# Patient Record
Sex: Male | Born: 2000
Health system: Southern US, Community
[De-identification: ages and names within clinical notes are randomized; demographics above are authoritative.]

## PROBLEM LIST (undated history)

## (undated) DIAGNOSIS — F909 Attention-deficit hyperactivity disorder, unspecified type: Secondary | ICD-10-CM

## (undated) DIAGNOSIS — R1115 Cyclical vomiting syndrome unrelated to migraine: Secondary | ICD-10-CM

## (undated) HISTORY — DX: Attention-deficit hyperactivity disorder, unspecified type: F90.9

## (undated) HISTORY — DX: Cyclical vomiting syndrome unrelated to migraine: R11.15

---
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
## 2018-03-04 ENCOUNTER — Telehealth: Payer: Self-pay | Admitting: Internal Medicine

## 2018-03-04 NOTE — Telephone Encounter (Signed)
Copied from CRM [id number]. Topic: General - Other >> Mar 04, 2018  8:05 AM Gean BirchwoodWilliams-Neal, Sade R wrote: Pts mom is calling in stating that Dr.John stated he would take pt on as a new pt due to family being pts currently. Pt needs a new pt appt no verification stated he was accepted. Please call mom with information.   Callback # [phone]  Mother is Meryl DareChris Xu, Dr. Jonny RuizJohn are you aware of this & it is okay!

## 2018-03-04 NOTE — Telephone Encounter (Signed)
Ok with me 

## 2018-03-04 NOTE — Telephone Encounter (Signed)
Patient is scheduled for 04/03/18.

## 2018-03-04 NOTE — Telephone Encounter (Signed)
Can you please change this visit to NEW PATIENT?

## 2018-03-04 NOTE — Telephone Encounter (Signed)
LVM to inform patient we could set up a new patient appointment.   Please set up a new patient appointment - Type of visit as OV & in the notes, NEW PATIENT/Insurance info  Send us a message in this TE to inform us & we will have the manger override the OV to change it to NP.

## 2018-04-03 ENCOUNTER — Ambulatory Visit: Payer: 59 | Admitting: Internal Medicine

## 2018-04-03 ENCOUNTER — Encounter: Payer: Self-pay | Admitting: Internal Medicine

## 2018-04-03 VITALS — BP 114/76 | HR 67 | Temp 97.8°F | Ht 75.23 in | Wt 165.0 lb

## 2018-04-03 DIAGNOSIS — Z0000 Encounter for general adult medical examination without abnormal findings: Secondary | ICD-10-CM | POA: Diagnosis not present

## 2018-04-03 DIAGNOSIS — Z114 Encounter for screening for human immunodeficiency virus [HIV]: Secondary | ICD-10-CM

## 2018-04-03 DIAGNOSIS — F909 Attention-deficit hyperactivity disorder, unspecified type: Secondary | ICD-10-CM

## 2018-04-03 NOTE — Assessment & Plan Note (Signed)
Stable, declines need for tx

## 2018-04-03 NOTE — Assessment & Plan Note (Signed)

## 2018-04-03 NOTE — Patient Instructions (Signed)

## 2018-04-03 NOTE — Progress Notes (Signed)
Subjective:    Patient ID: Walter Wallace, male    DOB: 2000/12/28, 17 y.o.   MRN: [id number]  HPI  Here as new pt to establish; Here for wellness;  Overall doing ok;  Pt denies Chest pain, worsening SOB, DOE, wheezing, orthopnea, PND, worsening LE edema, palpitations, dizziness or syncope.  Pt denies neurological change such as new headache, facial or extremity weakness.  Pt denies polydipsia, polyuria, or low sugar symptoms. Pt states overall good compliance with treatment and medications, good tolerability, and has been trying to follow appropriate diet.  Pt denies worsening depressive symptoms, suicidal ideation or panic. No fever, night sweats, wt loss, loss of appetite, or other constitutional symptoms.  Pt states good ability with ADL's, has low fall risk, home safety reviewed and adequate, no other significant changes in hearing or vision, and occasionally active with exercise. No tobacco for 3 mo.   No new complaints.  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 Does not feel he needs med for ADD Past Medical History:  Diagnosis Date  . ADHD (attention deficit hyperactivity disorder)   . Cyclic vomiting syndrome    History reviewed. No pertinent surgical history.  reports that he quit smoking about 1 years ago. His smoking use included cigarettes. He started smoking about 4 years ago. He smoked 0.00 packs per day. He has never used smokeless tobacco. He reports that he has current or past drug history. Drug: Marijuana. Frequency: 4.00 times per week. He reports that he does not drink alcohol. family history includes ADD / ADHD in his brother; Cholelithiasis in his maternal grandmother; GER disease in his maternal grandmother; Irritable bowel syndrome in his maternal grandmother; Migraines in his maternal grandmother; Pancreatitis in his paternal grandmother. No Known Allergies No current outpatient medications on file prior to visit.    No current facility-administered medications on file prior to visit.    Review of Systems Constitutional: Negative for other unusual diaphoresis, sweats, appetite or weight changes HENT: Negative for other worsening hearing loss, ear pain, facial swelling, mouth sores or neck stiffness.   Eyes: Negative for other worsening pain, redness or other visual disturbance.  Respiratory: Negative for other stridor or swelling Cardiovascular: Negative for other palpitations or other chest pain  Gastrointestinal: Negative for worsening diarrhea or loose stools, blood in stool, distention or other pain Genitourinary: Negative for hematuria, flank pain or other change in urine volume.  Musculoskeletal: Negative for myalgias or other joint swelling.  Skin: Negative for other color change, or other wound or worsening drainage.  Neurological: Negative for other syncope or numbness. Hematological: Negative for other adenopathy or swelling Psychiatric/Behavioral: Negative for hallucinations, other worsening agitation, SI, self-injury, or new decreased concentration All other system neg per pt    Objective:   Physical Exam BP 114/76   Pulse 67   Temp 97.8 F (36.6 C) (Oral)   Ht 6' 3.23" (1.911 m)   Wt 165 lb (74.8 kg)   SpO2 98%   BMI 20.50 kg/m  VS noted,  Constitutional: Pt is oriented to person, place, and time. Appears well-developed and well-nourished, in no significant distress and comfortable Head: Normocephalic and atraumatic  Eyes: Conjunctivae and EOM are normal. Pupils are equal, round, and reactive to light Right Ear: External ear normal without discharge Left Ear: External ear normal without discharge Nose: Nose without discharge or deformity Mouth/Throat: Oropharynx is without other ulcerations and moist  Neck: Normal range of  motion. Neck supple. No JVD present. No tracheal deviation present or significant neck LA or mass Cardiovascular: Normal rate, regular rhythm, normal heart  sounds and intact distal pulses.   Pulmonary/Chest: WOB normal and breath sounds without rales or wheezing  Abdominal: Soft. Bowel sounds are normal. NT. No HSM  Musculoskeletal: Normal range of motion. Exhibits no edema Lymphadenopathy: Has no other cervical adenopathy.  Neurological: Pt is alert and oriented to person, place, and time. Pt has normal reflexes. No cranial nerve deficit. Motor grossly intact, Gait intact Skin: Skin is warm and dry. No rash noted or new ulcerations Psychiatric:  Has normal mood and affect. Behavior is normal without agitation No other exam findings     Assessment & Plan:

## 2019-07-02 ENCOUNTER — Other Ambulatory Visit: Payer: Self-pay

## 2019-07-02 DIAGNOSIS — Z20822 Contact with and (suspected) exposure to covid-19: Secondary | ICD-10-CM

## 2019-07-03 LAB — NOVEL CORONAVIRUS, NAA: SARS-CoV-2, NAA: NOT DETECTED

## 2019-09-11 ENCOUNTER — Ambulatory Visit: Payer: 59 | Attending: Internal Medicine

## 2019-09-11 DIAGNOSIS — Z20822 Contact with and (suspected) exposure to covid-19: Secondary | ICD-10-CM

## 2019-09-12 LAB — NOVEL CORONAVIRUS, NAA: SARS-CoV-2, NAA: NOT DETECTED

## 2019-09-14 ENCOUNTER — Encounter: Payer: Self-pay | Admitting: Internal Medicine

## 2019-09-14 ENCOUNTER — Ambulatory Visit (INDEPENDENT_AMBULATORY_CARE_PROVIDER_SITE_OTHER): Payer: 59 | Admitting: Internal Medicine

## 2019-09-14 ENCOUNTER — Other Ambulatory Visit: Payer: Self-pay

## 2019-09-14 VITALS — BP 128/84 | HR 94 | Temp 97.8°F | Ht 75.0 in | Wt 165.0 lb

## 2019-09-14 DIAGNOSIS — Z0000 Encounter for general adult medical examination without abnormal findings: Secondary | ICD-10-CM | POA: Diagnosis not present

## 2019-09-14 DIAGNOSIS — Z114 Encounter for screening for human immunodeficiency virus [HIV]: Secondary | ICD-10-CM | POA: Diagnosis not present

## 2019-09-14 LAB — URINALYSIS, ROUTINE W REFLEX MICROSCOPIC
Bilirubin Urine: NEGATIVE
Hgb urine dipstick: NEGATIVE
Ketones, ur: NEGATIVE
Leukocytes,Ua: NEGATIVE
Nitrite: NEGATIVE
RBC / HPF: NONE SEEN (ref 0–?)
Specific Gravity, Urine: >=1.03 — AB (ref 1.000–1.030)
Urine Glucose: NEGATIVE
Urobilinogen, UA: 0.2 (ref 0.0–1.0)
pH: 6 (ref 5.0–8.0)

## 2019-09-14 LAB — CBC WITH DIFFERENTIAL/PLATELET
Basophils Absolute: 0 10*3/uL (ref 0.0–0.1)
Basophils Relative: 0.3 % (ref 0.0–3.0)
Eosinophils Absolute: 0 10*3/uL (ref 0.0–0.7)
Eosinophils Relative: 0.5 % (ref 0.0–5.0)
HCT: 46.4 % (ref 39.0–52.0)
Hemoglobin: 15.9 g/dL (ref 13.0–17.0)
Lymphocytes Relative: 25.9 % (ref 12.0–46.0)
Lymphs Abs: 2.3 10*3/uL (ref 0.7–4.0)
MCHC: 34.3 g/dL (ref 30.0–36.0)
MCV: 86.9 fl (ref 78.0–100.0)
Monocytes Absolute: 0.5 10*3/uL (ref 0.1–1.0)
Monocytes Relative: 5.5 % (ref 3.0–12.0)
Neutro Abs: 6 10*3/uL (ref 1.4–7.7)
Neutrophils Relative %: 67.8 % (ref 43.0–77.0)
Platelets: 249 10*3/uL (ref 150.0–400.0)
RBC: 5.34 Mil/uL (ref 4.22–5.81)
RDW: 13.4 % (ref 11.5–14.6)
WBC: 8.9 10*3/uL (ref 4.5–10.5)

## 2019-09-14 LAB — TSH: TSH: 1.74 u[IU]/mL (ref 0.35–5.50)

## 2019-09-14 LAB — HEPATIC FUNCTION PANEL
ALT: 13 U/L (ref 0–53)
AST: 17 U/L (ref 0–37)
Albumin: 5.1 g/dL (ref 3.5–5.2)
Alkaline Phosphatase: 70 U/L (ref 39–117)
Bilirubin, Direct: 0.1 mg/dL (ref 0.0–0.3)
Total Bilirubin: 0.5 mg/dL (ref 0.2–1.2)
Total Protein: 8.3 g/dL (ref 6.0–8.3)

## 2019-09-14 LAB — LIPID PANEL
Cholesterol: 148 mg/dL (ref 0–200)
HDL: 48.8 mg/dL (ref >39.00)
LDL Cholesterol: 83 mg/dL (ref 0–99)
NonHDL: 98.82
Total CHOL/HDL Ratio: 3
Triglycerides: 79 mg/dL (ref 0.0–149.0)
VLDL: 15.8 mg/dL (ref 0.0–40.0)

## 2019-09-14 LAB — BASIC METABOLIC PANEL
BUN: 11 mg/dL (ref 6–23)
CO2: 29 mEq/L (ref 19–32)
Calcium: 10.4 mg/dL (ref 8.4–10.5)
Chloride: 102 mEq/L (ref 96–112)
Creatinine, Ser: 0.97 mg/dL (ref 0.40–1.50)
GFR: 97.93 mL/min (ref >60.00)
Glucose, Bld: 83 mg/dL (ref 70–99)
Potassium: 3.6 mEq/L (ref 3.5–5.1)
Sodium: 140 mEq/L (ref 135–145)

## 2019-09-14 NOTE — Assessment & Plan Note (Signed)

## 2019-09-14 NOTE — Progress Notes (Signed)
Subjective:    Patient ID: Walter Wallace, male    DOB: 2001-07-21, 19 y.o.   MRN: [id number]  HPI  Here for wellness and f/u;  Overall doing ok;  Pt denies Chest pain, worsening SOB, DOE, wheezing, orthopnea, PND, worsening LE edema, palpitations, dizziness or syncope.  Pt denies neurological change such as new headache, facial or extremity weakness.  Pt denies polydipsia, polyuria, or low sugar symptoms. Pt states overall good compliance with treatment and medications, good tolerability, and has been trying to follow appropriate diet.  Pt denies worsening depressive symptoms, suicidal ideation or panic. No fever, night sweats, wt loss, loss of appetite, or other constitutional symptoms.  Pt states good ability with ADL's, has low fall risk, home safety reviewed and adequate, no other significant changes in hearing or vision, and active with exercise.  Doing home renovations now.   Concerned he is not gaining wt. At one point was 185 then had lost up to 20 lb despite eating up to 4000 cal per day. Wt Readings from Last 3 Encounters:  09/14/19 165 lb (74.8 kg)  04/03/18 165 lb (74.8 kg) (67 %, Z= 0.44)*  09/21/15 161 lb (73 kg) (78 %, Z= 0.76)*   * Growth percentiles are based on CDC (Boys, 2-20 Years) data.   Past Medical History:  Diagnosis Date  . ADHD (attention deficit hyperactivity disorder)   . Cyclic vomiting syndrome    No past surgical history on file.  reports that he quit smoking about 3 years ago. His smoking use included cigarettes. He started smoking about 6 years ago. He smoked 0.00 packs per day. He has never used smokeless tobacco. He reports current drug use. Frequency: 4.00 times per week. Drug: Marijuana. He reports that he does not drink alcohol. family history includes ADD / ADHD in his brother; Cholelithiasis in his maternal grandmother; GER disease in his maternal grandmother; Irritable bowel syndrome in his maternal grandmother; Migraines in his maternal grandmother;  Pancreatitis in his paternal grandmother. No Known Allergies No current outpatient medications on file prior to visit.   No current facility-administered medications on file prior to visit.   Review of Systems  Constitutional: Negative for other unusual diaphoresis or sweats HENT: Negative for ear discharge or swelling Eyes: Negative for other worsening visual disturbances Respiratory: Negative for stridor or other swelling  Gastrointestinal: Negative for worsening distension or other blood Genitourinary: Negative for retention or other urinary change Musculoskeletal: Negative for other MSK pain or swelling Skin: Negative for color change or other new lesions Neurological: Negative for worsening tremors and other numbness  Psychiatric/Behavioral: Negative for worsening agitation or other fatigue All otherwise neg per pt     Objective:   Physical Exam BP 128/84   Pulse 94   Temp 97.8 F (36.6 C) (Oral)   Ht 6\' 3"  (1.905 m)   Wt 165 lb (74.8 kg)   SpO2 97%   BMI 20.62 kg/m  VS noted,  Constitutional: Pt appears in NAD HENT: Head: NCAT.  Right Ear: External ear normal.  Left Ear: External ear normal.  Eyes: . Pupils are equal, round, and reactive to light. Conjunctivae and EOM are normal Nose: without d/c or deformity Neck: Neck supple. Gross normal ROM Cardiovascular: Normal rate and regular rhythm.   Pulmonary/Chest: Effort normal and breath sounds without rales or wheezing.  Abd:  Soft, NT, ND, + BS, no organomegaly Neurological: Pt is alert. At baseline orientation, motor grossly intact Skin: Skin is warm. No rashes, other new  lesions, no LE edema Psychiatric: Pt behavior is normal without agitation  All otherwise neg per pt Lab Results  Component Value Date   WBC 8.9 09/14/2019   HGB 15.9 09/14/2019   HCT 46.4 09/14/2019   PLT 249.0 09/14/2019   GLUCOSE 83 09/14/2019   CHOL 148 09/14/2019   TRIG 79.0 09/14/2019   HDL 48.80 09/14/2019   LDLCALC 83 09/14/2019     ALT 13 09/14/2019   AST 17 09/14/2019   NA 140 09/14/2019   K 3.6 09/14/2019   CL 102 09/14/2019   CREATININE 0.97 09/14/2019   BUN 11 09/14/2019   CO2 29 09/14/2019   TSH 1.74 09/14/2019        Assessment & Plan:

## 2019-09-14 NOTE — Patient Instructions (Signed)
Please continue all other medications as before, and refills have been done if requested.  Please have the pharmacy call with any other refills you may need.  Please continue your efforts at being more active, low cholesterol diet, and weight control.  You are otherwise up to date with prevention measures today.  Please keep your appointments with your specialists as you may have planned  Please go to the LAB at the blood drawing area for the tests to be done  You will be contacted by phone if any changes need to be made immediately.  Otherwise, you will receive a letter about your results with an explanation, but please check with MyChart first.  Please remember to sign up for MyChart if you have not done so, as this will be important to you in the future with finding out test results, communicating by private email, and scheduling acute appointments online when needed.  Please make an Appointment to return for your 2 year visit, or sooner if needed

## 2019-09-15 LAB — HIV ANTIBODY (ROUTINE TESTING W REFLEX): HIV 1&2 Ab, 4th Generation: NONREACTIVE

## 2020-11-30 ENCOUNTER — Telehealth (INDEPENDENT_AMBULATORY_CARE_PROVIDER_SITE_OTHER): Payer: 59 | Admitting: Internal Medicine

## 2020-11-30 DIAGNOSIS — J029 Acute pharyngitis, unspecified: Secondary | ICD-10-CM

## 2020-11-30 MED ORDER — AZITHROMYCIN 250 MG PO TABS: ORAL_TABLET | ORAL | 0 refills | Status: DC

## 2020-11-30 NOTE — Patient Instructions (Signed)
Please take all new medication as prescribed - the antibiotic  Please also check the COVID test you have with you and let us know results if positive  Please continue all other medications as before, and refills have been done if requested.  Please have the pharmacy call with any other refills you may need.  Please keep your appointments with your specialists as you may have planned

## 2020-11-30 NOTE — Progress Notes (Signed)
Patient ID: Walter Wallace, male   DOB: 05-23-2001, 20 y.o.   MRN: [id number]  Virtual Visit via Video Note  I connected with Walter Wallace on 11/30/20 at 11:20 AM EDT by a video enabled telemedicine application and verified that I am speaking with the correct person using two identifiers.  Location of all participants today Patient: at home Provider: at office   I discussed the limitations of evaluation and management by telemedicine and the availability of in person appointments. The patient expressed understanding and agreed to proceed.  History of Present Illness:  Here with 6 days acute onset fever, ST pain, pressure, headache, general weakness and malaise, but pt denies chest pain, wheezing, increased sob or doe, orthopnea, PND, increased LE swelling, palpitations, dizziness or syncope. Denies new focal neuro s/s,  Pt denies polydipsia, polyuria, No other new complaints Past Medical History:  Diagnosis Date  . ADHD (attention deficit hyperactivity disorder)   . Cyclic vomiting syndrome    No past surgical history on file.  reports that he quit smoking about 4 years ago. His smoking use included cigarettes. He started smoking about 7 years ago. He smoked 0.00 packs per day. He has never used smokeless tobacco. He reports current drug use. Frequency: 4.00 times per week. Drug: Marijuana. He reports that he does not drink alcohol. family history includes ADD / ADHD in his brother; Cholelithiasis in his maternal grandmother; GER disease in his maternal grandmother; Irritable bowel syndrome in his maternal grandmother; Migraines in his maternal grandmother; Pancreatitis in his paternal grandmother. No Known Allergies No current outpatient medications on file prior to visit.   No current facility-administered medications on file prior to visit.    Observations/Objective: Alert, NAD, appropriate mood and affect, resps normal, cn 2-12 intact, moves all 4s, no visible rash or swelling Lab  Results  Component Value Date   WBC 8.9 09/14/2019   HGB 15.9 09/14/2019   HCT 46.4 09/14/2019   PLT 249.0 09/14/2019   GLUCOSE 83 09/14/2019   CHOL 148 09/14/2019   TRIG 79.0 09/14/2019   HDL 48.80 09/14/2019   LDLCALC 83 09/14/2019   ALT 13 09/14/2019   AST 17 09/14/2019   NA 140 09/14/2019   K 3.6 09/14/2019   CL 102 09/14/2019   CREATININE 0.97 09/14/2019   BUN 11 09/14/2019   CO2 29 09/14/2019   TSH 1.74 09/14/2019   Assessment and Plan: See notes  Follow Up Instructions: See notes   I discussed the assessment and treatment plan with the patient. The patient was provided an opportunity to ask questions and all were answered. The patient agreed with the plan and demonstrated an understanding of the instructions.   The patient was advised to call back or seek an in-person evaluation if the symptoms worsen or if the condition fails to improve as anticipated.  Walter Barre, MD

## 2020-12-05 ENCOUNTER — Encounter: Payer: Self-pay | Admitting: Internal Medicine

## 2020-12-05 NOTE — Assessment & Plan Note (Signed)
With severe pain worsening x 6 days with fever, for zpack, and check covid with let us know results,  to f/u any worsening symptoms or concerns

## 2021-09-21 ENCOUNTER — Encounter: Payer: Self-pay | Admitting: Internal Medicine

## 2022-07-04 ENCOUNTER — Ambulatory Visit (INDEPENDENT_AMBULATORY_CARE_PROVIDER_SITE_OTHER): Payer: 59 | Admitting: Internal Medicine

## 2022-07-04 VITALS — BP 126/72 | HR 98 | Temp 98.3°F | Ht 75.0 in | Wt 157.0 lb

## 2022-07-04 DIAGNOSIS — Z0000 Encounter for general adult medical examination without abnormal findings: Secondary | ICD-10-CM

## 2022-07-04 DIAGNOSIS — E538 Deficiency of other specified B group vitamins: Secondary | ICD-10-CM | POA: Diagnosis not present

## 2022-07-04 DIAGNOSIS — E559 Vitamin D deficiency, unspecified: Secondary | ICD-10-CM

## 2022-07-04 DIAGNOSIS — R634 Abnormal weight loss: Secondary | ICD-10-CM | POA: Diagnosis not present

## 2022-07-04 LAB — URINALYSIS, ROUTINE W REFLEX MICROSCOPIC
Bilirubin Urine: NEGATIVE
Hgb urine dipstick: NEGATIVE
Ketones, ur: NEGATIVE
Leukocytes,Ua: NEGATIVE
Nitrite: NEGATIVE
RBC / HPF: NONE SEEN (ref 0–?)
Specific Gravity, Urine: 1.02 (ref 1.000–1.030)
Total Protein, Urine: 30 — AB
Urine Glucose: NEGATIVE
Urobilinogen, UA: 0.2 (ref 0.0–1.0)
pH: 8 (ref 5.0–8.0)

## 2022-07-04 LAB — CBC WITH DIFFERENTIAL/PLATELET
Basophils Absolute: 0 10*3/uL (ref 0.0–0.1)
Basophils Relative: 0.3 % (ref 0.0–3.0)
Eosinophils Absolute: 0 10*3/uL (ref 0.0–0.7)
Eosinophils Relative: 0.4 % (ref 0.0–5.0)
HCT: 43.6 % (ref 39.0–52.0)
Hemoglobin: 14.7 g/dL (ref 13.0–17.0)
Lymphocytes Relative: 22.7 % (ref 12.0–46.0)
Lymphs Abs: 1.5 10*3/uL (ref 0.7–4.0)
MCHC: 33.6 g/dL (ref 30.0–36.0)
MCV: 87.2 fl (ref 78.0–100.0)
Monocytes Absolute: 0.4 10*3/uL (ref 0.1–1.0)
Monocytes Relative: 5.5 % (ref 3.0–12.0)
Neutro Abs: 4.7 10*3/uL (ref 1.4–7.7)
Neutrophils Relative %: 71.1 % (ref 43.0–77.0)
Platelets: 256 10*3/uL (ref 150.0–400.0)
RBC: 5 Mil/uL (ref 4.22–5.81)
RDW: 13.8 % (ref 11.5–15.5)
WBC: 6.6 10*3/uL (ref 4.0–10.5)

## 2022-07-04 LAB — LIPID PANEL
Cholesterol: 137 mg/dL (ref 0–200)
HDL: 47.1 mg/dL (ref >39.00)
LDL Cholesterol: 80 mg/dL (ref 0–99)
NonHDL: 90.02
Total CHOL/HDL Ratio: 3
Triglycerides: 48 mg/dL (ref 0.0–149.0)
VLDL: 9.6 mg/dL (ref 0.0–40.0)

## 2022-07-04 LAB — BASIC METABOLIC PANEL
BUN: 13 mg/dL (ref 6–23)
CO2: 31 mEq/L (ref 19–32)
Calcium: 9.9 mg/dL (ref 8.4–10.5)
Chloride: 102 mEq/L (ref 96–112)
Creatinine, Ser: 0.92 mg/dL (ref 0.40–1.50)
GFR: 117.21 mL/min (ref >60.00)
Glucose, Bld: 88 mg/dL (ref 70–99)
Potassium: 3.9 mEq/L (ref 3.5–5.1)
Sodium: 139 mEq/L (ref 135–145)

## 2022-07-04 LAB — VITAMIN B12: Vitamin B-12: 563 pg/mL (ref 211–911)

## 2022-07-04 LAB — HEPATIC FUNCTION PANEL
ALT: 12 U/L (ref 0–53)
AST: 15 U/L (ref 0–37)
Albumin: 4.6 g/dL (ref 3.5–5.2)
Alkaline Phosphatase: 43 U/L (ref 39–117)
Bilirubin, Direct: 0.1 mg/dL (ref 0.0–0.3)
Total Bilirubin: 0.3 mg/dL (ref 0.2–1.2)
Total Protein: 7.5 g/dL (ref 6.0–8.3)

## 2022-07-04 LAB — TSH: TSH: 0.89 u[IU]/mL (ref 0.35–5.50)

## 2022-07-04 LAB — VITAMIN D 25 HYDROXY (VIT D DEFICIENCY, FRACTURES): VITD: 39.57 ng/mL (ref 30.00–100.00)

## 2022-07-04 NOTE — Progress Notes (Signed)
Patient ID: Walter Wallace, male   DOB: 2000/12/11, 21 y.o.   MRN: [id number]         Chief Complaint:: wellness exam and Weight loss         HPI:  Walter Wallace is a 21 y.o. male here for wellness exam; declines covid booster, flu shot, tdap, phv and hep c screen.  O/w up to date                        Also started running, and even more after broke up with girlfriend 4 mo ago.  Appetite lower with running, Denies worsening reflux, abd pain, dysphagia, n/v, bowel change or blood.  Hoping to slow his metabolism or increase the appetite. Denies hyper or hypo thyroid symptoms such as voice, skin or hair change.  Peak wt has been 184, now 153 at home today.  Has gained a few 7 lbs back in 1.5 wks while holding on the running recently.    Wt Readings from Last 3 Encounters:  07/04/22 157 lb (71.2 kg)  09/14/19 165 lb (74.8 kg)  04/03/18 165 lb (74.8 kg) (67 %, Z= 0.44)*   * Growth percentiles are based on CDC (Boys, 2-20 Years) data.   BP Readings from Last 3 Encounters:  07/04/22 126/72  09/14/19 128/84  04/03/18 114/76   There is no immunization history on file for this patient.There are no preventive care reminders to display for this patient.    Past Medical History:  Diagnosis Date   ADHD (attention deficit hyperactivity disorder)    Cyclic vomiting syndrome    History reviewed. No pertinent surgical history.  reports that he quit smoking about 6 years ago. His smoking use included cigarettes. He started smoking about 8 years ago. He has never used smokeless tobacco. He reports current drug use. Frequency: 4.00 times per week. Drug: Marijuana. He reports that he does not drink alcohol. family history includes ADD / ADHD in his brother; Cholelithiasis in his maternal grandmother; GER disease in his maternal grandmother; Irritable bowel syndrome in his maternal grandmother; Migraines in his maternal grandmother; Pancreatitis in his paternal grandmother. No Known Allergies Current  Outpatient Medications on File Prior to Visit  Medication Sig Dispense Refill   azithromycin (ZITHROMAX) 250 MG tablet 2 tab by mouth day 1, then 1 per day (Patient not taking: Reported on 07/04/2022) 6 tablet 0   No current facility-administered medications on file prior to visit.        ROS:  All others reviewed and negative.  Objective        PE:  BP 126/72 (BP Location: Right Arm, Patient Position: Sitting, Cuff Size: Large)   Pulse 98   Temp 98.3 F (36.8 C) (Oral)   Ht 6\' 3"  (1.905 m)   Wt 157 lb (71.2 kg)   SpO2 97%   BMI 19.62 kg/m                 Constitutional: Pt appears in NAD               HENT: Head: NCAT.                Right Ear: External ear normal.                 Left Ear: External ear normal.                Eyes: . Pupils are equal, round, and reactive to light.  Conjunctivae and EOM are normal               Nose: without d/c or deformity               Neck: Neck supple. Gross normal ROM               Cardiovascular: Normal rate and regular rhythm.                 Pulmonary/Chest: Effort normal and breath sounds without rales or wheezing.                Abd:  Soft, NT, ND, + BS, no organomegaly               Neurological: Pt is alert. At baseline orientation, motor grossly intact               Skin: Skin is warm. No rashes, no other new lesions, LE edema - none               Psychiatric: Pt behavior is normal without agitation   Micro: none  Cardiac tracings I have personally interpreted today:  none  Pertinent Radiological findings (summarize): none   Lab Results  Component Value Date   WBC 6.6 07/04/2022   HGB 14.7 07/04/2022   HCT 43.6 07/04/2022   PLT 256.0 07/04/2022   GLUCOSE 88 07/04/2022   CHOL 137 07/04/2022   TRIG 48.0 07/04/2022   HDL 47.10 07/04/2022   LDLCALC 80 07/04/2022   ALT 12 07/04/2022   AST 15 07/04/2022   NA 139 07/04/2022   K 3.9 07/04/2022   CL 102 07/04/2022   CREATININE 0.92 07/04/2022   BUN 13 07/04/2022   CO2 31  07/04/2022   TSH 0.89 07/04/2022   Assessment/Plan:  Walter Wallace is a 21 y.o. White or Caucasian [1] male with  has a past medical history of ADHD (attention deficit hyperactivity disorder) and Cyclic vomiting syndrome.  Preventative health care Age and sex appropriate education and counseling updated with regular exercise and diet Referrals for preventative services - declines hep c screen Immunizations addressed - declines tdap, covid, flu and hpv vax Smoking counseling  - none needed Evidence for depression or other mood disorder - none significant Most recent labs reviewed. I have personally reviewed and have noted: 1) the patient's medical and social history 2) The patient's current medications and supplements 3) The patient's height, weight, and BMI have been recorded in the chart   Weight loss Recent wt loss with exercise appears physiologi, pt reassured, no specific further evaluation pending further weight change  Followup: Return in about 1 year (around 07/05/2023).  Cathlean Cower, MD 07/06/2022 7:51 PM [facility]

## 2022-07-04 NOTE — Patient Instructions (Signed)

## 2022-07-06 ENCOUNTER — Encounter: Payer: Self-pay | Admitting: Internal Medicine

## 2022-07-06 NOTE — Assessment & Plan Note (Signed)
Recent wt loss with exercise appears physiologi, pt reassured, no specific further evaluation pending further weight change

## 2022-07-06 NOTE — Assessment & Plan Note (Signed)
Age and sex appropriate education and counseling updated with regular exercise and diet Referrals for preventative services - declines hep c screen Immunizations addressed - declines tdap, covid, flu and hpv vax Smoking counseling  - none needed Evidence for depression or other mood disorder - none significant Most recent labs reviewed. I have personally reviewed and have noted: 1) the patient's medical and social history 2) The patient's current medications and supplements 3) The patient's height, weight, and BMI have been recorded in the chart

## 2022-08-24 ENCOUNTER — Ambulatory Visit: Payer: 59 | Admitting: Internal Medicine

## 2023-07-09 ENCOUNTER — Ambulatory Visit: Payer: 59 | Admitting: Internal Medicine

## 2023-07-09 ENCOUNTER — Encounter: Payer: Self-pay | Admitting: Internal Medicine

## 2023-07-09 VITALS — BP 110/70 | HR 55 | Temp 99.3°F | Ht 75.0 in | Wt 163.0 lb

## 2023-07-09 DIAGNOSIS — R5383 Other fatigue: Secondary | ICD-10-CM

## 2023-07-09 DIAGNOSIS — J069 Acute upper respiratory infection, unspecified: Secondary | ICD-10-CM | POA: Diagnosis not present

## 2023-07-09 DIAGNOSIS — Z0001 Encounter for general adult medical examination with abnormal findings: Secondary | ICD-10-CM | POA: Diagnosis not present

## 2023-07-09 DIAGNOSIS — E559 Vitamin D deficiency, unspecified: Secondary | ICD-10-CM

## 2023-07-09 DIAGNOSIS — Z1159 Encounter for screening for other viral diseases: Secondary | ICD-10-CM

## 2023-07-09 NOTE — Assessment & Plan Note (Signed)
Recent likely viral , resolving, no specific tx needed, reassured

## 2023-07-09 NOTE — Patient Instructions (Addendum)
Please continue all other medications as before, and refills have been done if requested.  Please have the pharmacy call with any other refills you may need.  Please continue your efforts at being more active, low cholesterol diet, and weight control.  You are otherwise up to date with prevention measures today.  Please keep your appointments with your specialists as you may have planned  Please go to the LAB at the blood drawing area for the tests to be done  You will be contacted by phone if any changes need to be made immediately.  Otherwise, you will receive a letter about your results with an explanation, but please check with MyChart first.  Please make an Appointment to return for your 1 year visit, or sooner if needed, with Lab testing by Appointment as well, to be done about 3-5 days before at the FIRST FLOOR Lab (so this is for TWO appointments - please see the scheduling desk as you leave)

## 2023-07-09 NOTE — Assessment & Plan Note (Signed)
Pt requesting thyroid check - for thyroid panel

## 2023-07-09 NOTE — Progress Notes (Signed)
Patient ID: Walter Wallace, male   DOB: [birth year], 22 y.o.   MRN: [id number]         Chief Complaint:: wellness exam and Annual Exam (Wants to get thyroid checked )  , low vit d, uri       HPI:  Walter Wallace is a 22 y.o. male here for wellness exam; declines all immunizations, due for hep c screen, o/w up to date                        Also Pt denies chest pain, increased sob or doe, wheezing, orthopnea, PND, increased LE swelling, palpitations, dizziness or syncope.   Pt denies polydipsia, polyuria, or new focal neuro s/s.    Pt denies fever, wt loss, night sweats, loss of appetite, or other constitutional symptoms  Had recent Uri last wk, now improving.  Not taking Vit D.  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  Does c/o ongoing fatigue, but denies signficant daytime hypersomnolence. Asking for thyroid check as family has similar.  Wt Readings from Last 3 Encounters:  07/09/23 163 lb (73.9 kg)  07/04/22 157 lb (71.2 kg)  09/14/19 165 lb (74.8 kg)   BP Readings from Last 3 Encounters:  07/09/23 110/70  07/04/22 126/72  09/14/19 128/84   There is no immunization history on file for this patient. Health Maintenance Due  Topic Date Due   HPV VACCINES (1 - Male 3-dose series) Never done   Hepatitis C Screening  Never done   DTaP/Tdap/Td (1 - Tdap) Never done   INFLUENZA VACCINE  Never done   COVID-19 Vaccine (1 - 2023-24 season) Never done      Past Medical History:  Diagnosis Date   ADHD (attention deficit hyperactivity disorder)    Cyclic vomiting syndrome    History reviewed. No pertinent surgical history.  reports that he quit smoking about 9 years ago. His smoking use included cigarettes. He has never used smokeless tobacco. He reports current drug use. Frequency: 4.00 times per week. Drug: Marijuana. He reports that he does not drink alcohol. family history includes ADD / ADHD in his brother; Cholelithiasis in his maternal grandmother; GER disease in his  maternal grandmother; Irritable bowel syndrome in his maternal grandmother; Migraines in his maternal grandmother; Pancreatitis in his paternal grandmother. No Known Allergies No current outpatient medications on file prior to visit.   No current facility-administered medications on file prior to visit.        ROS:  All others reviewed and negative.  Objective        PE:  BP 110/70 (BP Location: Right Arm, Patient Position: Sitting, Cuff Size: Normal)   Pulse (!) 55   Temp 99.3 F (37.4 C) (Oral)   Ht 6\' 3"  (1.905 m)   Wt 163 lb (73.9 kg)   SpO2 99%   BMI 20.37 kg/m                 Constitutional: Pt appears in NAD               HENT: Head: NCAT.                Right Ear: External ear normal.                 Left Ear: External ear normal.                Eyes: . Pupils are equal, round, and reactive to  light. Conjunctivae and EOM are normal               Nose: without d/c or deformity               Neck: Neck supple. Gross normal ROM               Cardiovascular: Normal rate and regular rhythm.                 Pulmonary/Chest: Effort normal and breath sounds without rales or wheezing.                Abd:  Soft, NT, ND, + BS, no organomegaly               Neurological: Pt is alert. At baseline orientation, motor grossly intact               Skin: Skin is warm. No rashes, no other new lesions, LE edema - none               Psychiatric: Pt behavior is normal without agitation   Micro: none  Cardiac tracings I have personally interpreted today:  none  Pertinent Radiological findings (summarize): none   Lab Results  Component Value Date   WBC 6.6 07/04/2022   HGB 14.7 07/04/2022   HCT 43.6 07/04/2022   PLT 256.0 07/04/2022   GLUCOSE 88 07/04/2022   CHOL 137 07/04/2022   TRIG 48.0 07/04/2022   HDL 47.10 07/04/2022   LDLCALC 80 07/04/2022   ALT 12 07/04/2022   AST 15 07/04/2022   NA 139 07/04/2022   K 3.9 07/04/2022   CL 102 07/04/2022   CREATININE 0.92 07/04/2022    BUN 13 07/04/2022   CO2 31 07/04/2022   TSH 0.89 07/04/2022   Assessment/Plan:  Walter Wallace is a 22 y.o. White or Caucasian [1] male with  has a past medical history of ADHD (attention deficit hyperactivity disorder) and Cyclic vomiting syndrome.  URI (upper respiratory infection) Recent likely viral , resolving, no specific tx needed, reassured  Vitamin D deficiency Last vitamin D Lab Results  Component Value Date   VD25OH 39.57 07/04/2022   Low, to start oral replacement   Fatigue Pt requesting thyroid check - for thyroid panel  Encounter for well adult exam with abnormal findings Age and sex appropriate education and counseling updated with regular exercise and diet Referrals for preventative services - none needed Immunizations addressed - declines immunizations Smoking counseling  - none needed Evidence for depression or other mood disorder - none significant Most recent labs reviewed. I have personally reviewed and have noted: 1) the patient's medical and social history 2) The patient's current medications and supplements 3) The patient's height, weight, and BMI have been recorded in the chart  Followup: Return in about 1 year (around 07/08/2024).  Walter Barre, MD 07/09/2023 1:01 PM [facility]

## 2023-07-09 NOTE — Assessment & Plan Note (Signed)
Last vitamin D Lab Results  Component Value Date   VD25OH 39.57 07/04/2022   Low, to start oral replacement

## 2023-07-09 NOTE — Assessment & Plan Note (Signed)
Age and sex appropriate education and counseling updated with regular exercise and diet Referrals for preventative services - none needed Immunizations addressed - declines immunizations Smoking counseling  - none needed Evidence for depression or other mood disorder - none significant Most recent labs reviewed. I have personally reviewed and have noted: 1) the patient's medical and social history 2) The patient's current medications and supplements 3) The patient's height, weight, and BMI have been recorded in the chart

## 2023-12-23 ENCOUNTER — Other Ambulatory Visit

## 2023-12-23 DIAGNOSIS — Z1159 Encounter for screening for other viral diseases: Secondary | ICD-10-CM

## 2023-12-23 DIAGNOSIS — R5383 Other fatigue: Secondary | ICD-10-CM

## 2023-12-24 LAB — HEPATITIS C ANTIBODY: Hepatitis C Ab: NONREACTIVE

## 2023-12-24 LAB — THYROID PANEL WITH TSH
Free Thyroxine Index: 2.7 (ref 1.4–3.8)
T3 Uptake: 33 % (ref 22–35)
T4, Total: 8.3 ug/dL (ref 4.9–10.5)
TSH: 0.91 m[IU]/L (ref 0.40–4.50)

## 2023-12-26 ENCOUNTER — Ambulatory Visit (INDEPENDENT_AMBULATORY_CARE_PROVIDER_SITE_OTHER): Admitting: Internal Medicine

## 2023-12-26 ENCOUNTER — Encounter: Payer: Self-pay | Admitting: Internal Medicine

## 2023-12-26 VITALS — BP 120/66 | HR 50 | Temp 99.0°F | Ht 75.0 in | Wt 155.0 lb

## 2023-12-26 DIAGNOSIS — E785 Hyperlipidemia, unspecified: Secondary | ICD-10-CM

## 2023-12-26 DIAGNOSIS — F909 Attention-deficit hyperactivity disorder, unspecified type: Secondary | ICD-10-CM | POA: Diagnosis not present

## 2023-12-26 DIAGNOSIS — E538 Deficiency of other specified B group vitamins: Secondary | ICD-10-CM | POA: Diagnosis not present

## 2023-12-26 DIAGNOSIS — F419 Anxiety disorder, unspecified: Secondary | ICD-10-CM | POA: Insufficient documentation

## 2023-12-26 DIAGNOSIS — E559 Vitamin D deficiency, unspecified: Secondary | ICD-10-CM

## 2023-12-26 DIAGNOSIS — R4681 Obsessive-compulsive behavior: Secondary | ICD-10-CM | POA: Diagnosis not present

## 2023-12-26 MED ORDER — ESCITALOPRAM OXALATE 10 MG PO TABS: 10.0000 mg | ORAL_TABLET | Freq: Every day | ORAL | 3 refills | Status: AC

## 2023-12-26 NOTE — Progress Notes (Unsigned)
 Patient ID: Walter Wallace, male   DOB: 12-23-2000, 23 y.o.   MRN: [id number]        Chief Complaint: follow up anxiety with panic , insomnia, adhd, low vit d       HPI:  Walter Wallace is a 23 y.o. male here with c/o 2-3 mo worsening emotions; has been an ongoing issue some since 23yo - worsening sleep - falling into, staying and not wkaing up early; working 50 hrs so feels he should be doing better.  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  Saw derm with prn anit-hidrosis med.  Realizes now he has had increased anxiety stress.  Very insecure about his slight frame and cannot gain wt. Tends to get more panicky at stressful times, even with sex  Emotions seem unregulated, sad at times.  Thyroid  panel normal, so after talking to counseling he would like to consider ssri.  Pt denies chest pain, increased sob or doe, wheezing, orthopnea, PND, increased LE swelling, palpitations, dizziness or syncope.   Pt denies polydipsia, polyuria, or new focal neuro s/s.     Wt Readings from Last 3 Encounters:  12/26/23 155 lb (70.3 kg)  07/09/23 163 lb (73.9 kg)  07/04/22 157 lb (71.2 kg)   BP Readings from Last 3 Encounters:  12/26/23 120/66  07/09/23 110/70  07/04/22 126/72         Past Medical History:  Diagnosis Date   ADHD (attention deficit hyperactivity disorder)    Cyclic vomiting syndrome    History reviewed. No pertinent surgical history.  reports that he quit smoking about 10 years ago. His smoking use included cigarettes. He has never used smokeless tobacco. He reports current drug use. Frequency: 4.00 times per week. Drug: Marijuana. He reports that he does not drink alcohol. family history includes ADD / ADHD in his brother; Cholelithiasis in his maternal grandmother; GER disease in his maternal grandmother; Irritable bowel syndrome in his maternal grandmother; Migraines in his maternal grandmother;  Pancreatitis in his paternal grandmother. No Known Allergies No current outpatient medications on file prior to visit.   No current facility-administered medications on file prior to visit.        ROS:  All others reviewed and negative.  Objective        PE:  BP 120/66 (BP Location: Right Arm, Patient Position: Sitting, Cuff Size: Normal)   Pulse (!) 50   Temp 99 F (37.2 C) (Oral)   Ht 6\' 3"  (1.905 m)   Wt 155 lb (70.3 kg)   SpO2 100%   BMI 19.37 kg/m                 Constitutional: Pt appears in NAD               HENT: Head: NCAT.                Right Ear: External ear normal.                 Left Ear: External ear normal.                Eyes: . Pupils are equal, round, and reactive to light. Conjunctivae and EOM are normal               Nose: without d/c or deformity  Neck: Neck supple. Gross normal ROM               Cardiovascular: Normal rate and regular rhythm.                 Pulmonary/Chest: Effort normal and breath sounds without rales or wheezing.                Abd:  Soft, NT, ND, + BS, no organomegaly               Neurological: Pt is alert. At baseline orientation, motor grossly intact               Skin: Skin is warm. No rashes, no other new lesions, LE edema - none               Psychiatric: Pt behavior is normal without agitation , mod nervous  Micro: none  Cardiac tracings I have personally interpreted today:  none  Pertinent Radiological findings (summarize): none   Lab Results  Component Value Date   WBC 6.6 07/04/2022   HGB 14.7 07/04/2022   HCT 43.6 07/04/2022   PLT 256.0 07/04/2022   GLUCOSE 88 07/04/2022   CHOL 137 07/04/2022   TRIG 48.0 07/04/2022   HDL 47.10 07/04/2022   LDLCALC 80 07/04/2022   ALT 12 07/04/2022   AST 15 07/04/2022   NA 139 07/04/2022   K 3.9 07/04/2022   CL 102 07/04/2022   CREATININE 0.92 07/04/2022   BUN 13 07/04/2022   CO2 31 07/04/2022   TSH 0.91 12/23/2023   Assessment/Plan:  Walter Wallace is a  23 y.o. White or Caucasian [1] male with  has a past medical history of ADHD (attention deficit hyperactivity disorder) and Cyclic vomiting syndrome.  ADHD (attention deficit hyperactivity disorder) Stable overall, cont current adderall  Vitamin D  deficiency Last vitamin D  Lab Results  Component Value Date   VD25OH 39.57 07/04/2022   Low, to start oral replacement   Anxiety Mod to severe at times, for lexapro  10 mg every day,  to f/u any worsening symptoms or concerns  Obsessive behaviors Pt to continue counseling  Followup: Return in about 6 months (around 06/27/2024).  Rosalia Colonel, MD 12/28/2023 8:05 PM [facility]

## 2023-12-26 NOTE — Patient Instructions (Signed)
 Please take all new medication as prescribed  - the lexapro  10 mg  Please continue all other medications as before, and refills have been done if requested.  Please have the pharmacy call with any other refills you may need.  Please continue your efforts at being more active, low cholesterol diet, and weight control.  Please keep your appointments with your specialists as you may have planned - counseling  Please make an Appointment to return in 6 months, or sooner if needed, also with Lab Appointment for testing done 3-5 days before at the FIRST FLOOR Lab (so this is for TWO appointments - please see the scheduling desk as you leave)

## 2023-12-28 ENCOUNTER — Encounter: Payer: Self-pay | Admitting: Internal Medicine

## 2023-12-28 NOTE — Assessment & Plan Note (Signed)
 Mod to severe at times, for lexapro  10 mg every day,  to f/u any worsening symptoms or concerns

## 2023-12-28 NOTE — Assessment & Plan Note (Signed)
Pt to continue counseling

## 2023-12-28 NOTE — Assessment & Plan Note (Signed)
 Last vitamin D Lab Results  Component Value Date   VD25OH 39.57 07/04/2022   Low, to start oral replacement

## 2023-12-28 NOTE — Addendum Note (Signed)
 Addended by: Roslyn Coombe on: 12/28/2023 08:08 PM   Modules accepted: Orders

## 2023-12-28 NOTE — Assessment & Plan Note (Signed)
 Stable overall, cont current adderall

## 2024-01-06 ENCOUNTER — Other Ambulatory Visit: Payer: Self-pay | Admitting: Internal Medicine

## 2024-01-06 DIAGNOSIS — E559 Vitamin D deficiency, unspecified: Secondary | ICD-10-CM

## 2024-01-06 DIAGNOSIS — E785 Hyperlipidemia, unspecified: Secondary | ICD-10-CM

## 2024-01-06 DIAGNOSIS — E538 Deficiency of other specified B group vitamins: Secondary | ICD-10-CM

## 2024-01-23 ENCOUNTER — Ambulatory Visit: Admitting: Internal Medicine

## 2024-05-11 ENCOUNTER — Telehealth: Payer: Self-pay | Admitting: Internal Medicine

## 2024-05-11 DIAGNOSIS — R634 Abnormal weight loss: Secondary | ICD-10-CM

## 2024-05-11 NOTE — Telephone Encounter (Signed)
 Copied from CRM [id number]. Topic: Referral - Request for Referral >> May 11, 2024 12:13 PM Wess RAMAN wrote: Did the patient discuss referral with their provider in the last year? Yes (If No - schedule appointment) (If Yes - send message)  Appointment offered? No  Type of order/referral and detailed reason for visit: Nutritionist  Preference of office, provider, location: [facility] [address], [place], [place] [address] Phone: [phone] Fax: [phone]  If referral order, have you been seen by this specialty before? No (If Yes, this issue or another issue? When? Where?  Can we respond through MyChart? Yes

## 2024-05-14 NOTE — Addendum Note (Signed)
 Addended by: NORLEEN LYNWOOD ORN on: 05/14/2024 04:54 PM   Modules accepted: Orders

## 2024-05-14 NOTE — Telephone Encounter (Signed)
 Ok this is done

## 2024-06-01 NOTE — Telephone Encounter (Signed)
 Called and informed patient that referral had been placed and authorized. Provided him with the phone number to the [facility] Nutrition and Diabetes department

## 2024-07-10 ENCOUNTER — Encounter: Payer: 59 | Admitting: Internal Medicine

## 2024-07-13 ENCOUNTER — Encounter: Payer: Self-pay | Admitting: Dietician

## 2024-07-13 ENCOUNTER — Encounter: Attending: Internal Medicine | Admitting: Dietician

## 2024-07-13 VITALS — Ht 75.0 in | Wt 148.4 lb

## 2024-07-13 DIAGNOSIS — R634 Abnormal weight loss: Secondary | ICD-10-CM | POA: Insufficient documentation

## 2024-07-13 NOTE — Progress Notes (Signed)
 Medical Nutrition Therapy  Appointment Start time:  [phone]  Appointment End time:  0930  Primary concerns today: Weight Gain  Referral diagnosis: R63.4 (ICD-10-CM) - Weight loss  Preferred learning style: No preference indicated Learning readiness: Ready   NUTRITION ASSESSMENT   Anthropometrics Ht: 75 Wt: 148.4 lbs BMI: 18.55 kg/m2 UBW: 175 lbs Goal Weight:~200 lbs  Clinical Medical Hx: Weight loss, ADHD, Anxiety, Obsessive behaviors Medications: N/A Labs: Reviewed Notable Signs/Symptoms: Weight Loss   Lifestyle & Dietary Hx Pt reports desire to gain weight, states this is the skinniest they have ever been. Pt reports losing 42 lbs over the last year, no significant changes in appetite.  Pt reports initially starting to lose weight after breakup late 2023, started running a lot, but stopped and gained weight back by eating ~4,000 kcal a day. Pt reports father passed away a couple months ago, increasing already high stress, pt reports they will not eat when stressed. 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  Pt reports not eating during the day 3-4 times a week, will overeat the next day and feel overly stuffed then not eat much fir the next 24 hrs.   Estimated daily fluid intake: <64 oz Supplements: None Sleep: Irregular Stress / self-care: 10/10, work and life Current average weekly physical activity: ADLs, Very active at work   24-Hr Dietary Recall First Meal: 5 pieces of bacon, 1 slice French toast, 3 eggs, potatoes, iced coffee Snack:  Second Meal: Skipped lunch Snack:  Third Meal: [employer], 2 beef tacos, Mtn Dew Snack: 4 handfuls of salt and vinegar, 2 mini chocolates Beverages: Iced coffee,     NUTRITION DIAGNOSIS  Pomaria-3.2 Unintentional weight loss As related to inadequate energy intake.  As evidenced by weight loss of 42 lbs x12 months, very high activity level at work, inconsistent  caloric intake.   NUTRITION INTERVENTION  Nutrition education (E-1) on the following topics:  Educated patient on the role of increasing calories and protein on building muscle mass and healthy weight gain. Worked with patient to identify sources of protein and healthy fats, as well as how to increase calories when preparing food. Educated patient to eat 5-6 times a day to maximize protein intake. Encourage patient to incorporate physical activity, especially resistance training, as much as possible. Daily protein goal: 90 - 130 g (1.0 - 1.5g/kg Goal weight) Daily calorie goal: ~2,700 - 3,300 kcal (MSJ x1.375 - 1.725 AF Goal weight)   Handouts Provided Include  High Protein Eating for Recovery  Learning Style & Readiness for Change Teaching method utilized: Visual & Auditory  Demonstrated degree of understanding via: Teach Back  Barriers to learning/adherence to lifestyle change: Stress  Goals Established by Pt Spread your meals/snacks out to every 3 hours or so and eat until satisfied not full. Be sure to include a source of protein at each meal  Switch to Fairlife milk for higher protein, lower sugar option! Begin to incorporate resistance exercises 3-5 days of the week to stimulate your muscles to grow with a calorie surplus! Look for weight gain of ~1 lbs per week!   MONITORING & EVALUATION Dietary intake, weekly physical activity, and weight gain in 6 weeks.  Next Steps  Patient is to follow up with RD.

## 2024-07-13 NOTE — Patient Instructions (Addendum)
 Spread your meals/snacks out to every 3 hours or so and eat until satisfied not full. Be sure to include a source of protein at each meal   Switch to [employer]life milk for higher protein, lower sugar option!  Begin to incorporate resistance exercises 3-5 days of the week to stimulate your muscles to grow with a calorie surplus!  Look for weight gain of ~1 lbs per week!  Your protein/energy goals are as follows: Daily protein goal: 90 - 130 g (1.0 - 1.5g/kg Goal weight) Daily calorie goal: ~2,700 - 3,300 kcal (MSJ x1.375 - 1.725 AF Goal weight)

## 2024-08-24 ENCOUNTER — Encounter: Payer: Self-pay | Admitting: Dietician

## 2024-08-24 ENCOUNTER — Encounter: Admitting: Dietician

## 2024-08-24 VITALS — Ht 75.0 in | Wt 155.6 lb

## 2024-08-24 DIAGNOSIS — R634 Abnormal weight loss: Secondary | ICD-10-CM | POA: Diagnosis present

## 2024-08-24 NOTE — Progress Notes (Signed)
 Medical Nutrition Therapy  Appointment Start time:  [phone]  Appointment End time:  1000  Primary concerns today: Weight Gain  Referral diagnosis: R63.4 (ICD-10-CM) - Weight loss  Preferred learning style: No preference indicated Learning readiness: Change in Progress   NUTRITION ASSESSMENT   Anthropometrics Ht: 75 Wt: 155.6 lbs BMI: 19.45 kg/m2 UBW: 175 lbs Goal Weight:~200 lbs Wt Change: +6.2 lbs   Clinical Medical Hx: Weight loss, ADHD, Anxiety, Obsessive behaviors Medications: N/A Labs: Reviewed Notable Signs/Symptoms: Weight Loss   Lifestyle & Dietary Hx Pt reports making mental and dietary changes since last visit, trying to choose more calorically dense foods and take the time to eat throughout the day. Pt reports seeing changes in their body with weigh gain that are very motivating. Pt reports eating breakfast daily, states it was difficult initially but their appetite is growing in the mornings now. Pt reports using [employer] app to track food choices, trying to balance macros throughout the day, daily kcal goal of 3,300 kcal. Pt states they are taking cues to eat from being around employees and friends that eat more frequently. Pt reports struggling with eating every 3 hours consistently, mostly after breakfast, states dry foods (granola bars, PB, Nuts, crackers) make it tougher to eat as often. Pt states that they are not allowing stress to keep them from eating as much anymore. Pt reports increasing resistance exercise by doing push ups and/or pull ups daily, trying to do enough to feel exertion without exhaustion.   Estimated daily fluid intake: <64 oz Supplements: None Sleep: Irregular Stress / self-care: Lower Current average weekly physical activity: ADLs, Very active at work, Push ups/Pull ups   24-Hr Dietary Recall First Meal: [place] Sausage egg and cheese biscuit, Sausage and gravy biscuit, Hash brown, Lg sweet tea Snack:  Second Meal:2 slices of  dominos pepperoni pizza Snack:  Third Meal: Beef chalupa, chicken quesarito, [place], [place] group Snack:  Beverages: Iced coffee,     NUTRITION DIAGNOSIS  Perry-3.2 Unintentional weight loss As related to inadequate energy intake.  As evidenced by weight loss of 42 lbs x12 months, very high activity level at work, inconsistent caloric intake.   NUTRITION INTERVENTION  Nutrition education (E-1) on the following topics:  Educated patient on the role of increasing calories and protein on building muscle mass and healthy weight gain. Worked with patient to identify sources of protein and healthy fats, as well as how to increase calories when preparing food. Educated patient to eat 5-6 times a day to maximize protein intake. Encourage patient to incorporate physical activity, especially resistance training, as much as possible. Daily protein goal: 90 - 130 g (1.0 - 1.5g/kg Goal weight) Daily calorie goal: ~2,700 - 3,300 kcal (MSJ x1.375 - 1.725 AF Goal weight) Educate pt on factors that can elevate LDL cholesterol, including high dietary intake of saturated fats. Educate pt on identifying sources of saturated fats, and how to make alternative food choices to lower saturated fat intake.    Handouts Provided Include  High Protein Eating for Recovery NEW: Fats food list  Learning Style & Readiness for Change Teaching method utilized: Visual & Auditory  Demonstrated degree of understanding via: Teach Back  Barriers to learning/adherence to lifestyle change: None identified   Goals Established by Pt Work on getting more of your fat calories from unsaturated fats in place of saturated fats. Continue to make breakfast your biggest meal of the day! Continue to prioritize eating! Listen to your body when it tells you you're  hungry, and don't force it if you are full for a few hours after breakfast! Congratulations!! Keep up the great work!!   MONITORING & EVALUATION Dietary intake, weekly  physical activity, and weight gain in 8 weeks.  Next Steps  Patient is to follow up with RD.

## 2024-08-24 NOTE — Patient Instructions (Addendum)
 Work on getting more of your fat calories from unsaturated fats in place of saturated fats.  Continue to make breakfast your biggest meal of the day!  Continue to prioritize eating! Listen to your body when it tells you you're hungry, and don't force it if you are full for a few hours after breakfast!  Congratulations!! Keep up the great work!!

## 2024-10-26 ENCOUNTER — Encounter: Admitting: Dietician
